# Patient Record
Sex: Female | Born: 1950 | Race: White | Hispanic: No | Smoking: Former smoker
Health system: Southern US, Community
[De-identification: ages and names within clinical notes are randomized; demographics above are authoritative.]

## PROBLEM LIST (undated history)

## (undated) DIAGNOSIS — N289 Disorder of kidney and ureter, unspecified: Secondary | ICD-10-CM

## (undated) DIAGNOSIS — Z87891 Personal history of nicotine dependence: Secondary | ICD-10-CM

## (undated) HISTORY — DX: Personal history of nicotine dependence: Z87.891

## (undated) HISTORY — PX: ABDOMINAL HYSTERECTOMY: SHX81

## (undated) HISTORY — PX: BREAST SURGERY: SHX581

## (undated) HISTORY — PX: BACK SURGERY: SHX140

## (undated) HISTORY — PX: TONSILLECTOMY: SUR1361

---

## 2007-03-28 ENCOUNTER — Ambulatory Visit: Payer: Self-pay | Admitting: Family Medicine

## 2007-04-09 ENCOUNTER — Ambulatory Visit: Payer: Self-pay | Admitting: Family Medicine

## 2009-06-22 ENCOUNTER — Ambulatory Visit: Payer: Self-pay | Admitting: Family Medicine

## 2009-06-24 ENCOUNTER — Ambulatory Visit: Payer: Self-pay | Admitting: Family Medicine

## 2012-12-25 ENCOUNTER — Ambulatory Visit: Payer: Self-pay | Admitting: Family Medicine

## 2013-02-06 ENCOUNTER — Ambulatory Visit: Payer: Self-pay | Admitting: Physician Assistant

## 2013-06-28 ENCOUNTER — Ambulatory Visit: Payer: Self-pay | Admitting: Family Medicine

## 2013-07-02 ENCOUNTER — Ambulatory Visit: Payer: Self-pay | Admitting: Family Medicine

## 2013-10-26 ENCOUNTER — Ambulatory Visit: Payer: Self-pay | Admitting: Physician Assistant

## 2013-10-26 LAB — URINALYSIS, COMPLETE
BACTERIA: NEGATIVE
Blood: NEGATIVE
GLUCOSE, UR: NEGATIVE
KETONE: NEGATIVE
Leukocyte Esterase: NEGATIVE
NITRITE: NEGATIVE
PROTEIN: NEGATIVE
Ph: 5 (ref 5.0–8.0)
Specific Gravity: >=1.03 (ref 1.000–1.030)

## 2014-02-05 ENCOUNTER — Ambulatory Visit: Payer: Self-pay | Admitting: Hospice and Palliative Medicine

## 2014-12-06 IMAGING — US US CAROTID DUPLEX BILAT
1 series · 13 of 24 positions shown · non-contrast
Comparison: None.

CLINICAL DATA: Right carotid bruit.

EXAM:
BILATERAL CAROTID DUPLEX ULTRASOUND
TECHNIQUE: Gray scale imaging, color Doppler and duplex ultrasound were
performed of bilateral carotid and vertebral arteries in the neck.

[Series 1: us carotid duplex bilat · 0.06mm/px · 13 of 62 slices shown]
[im 1/62]
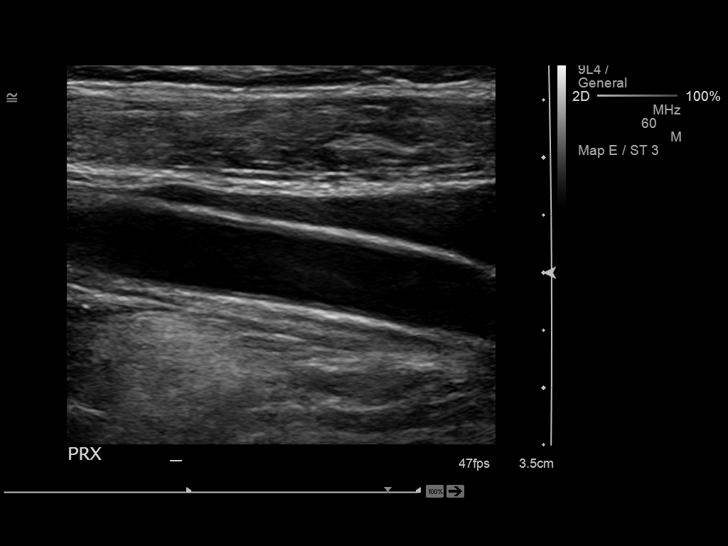
[im 6/62]
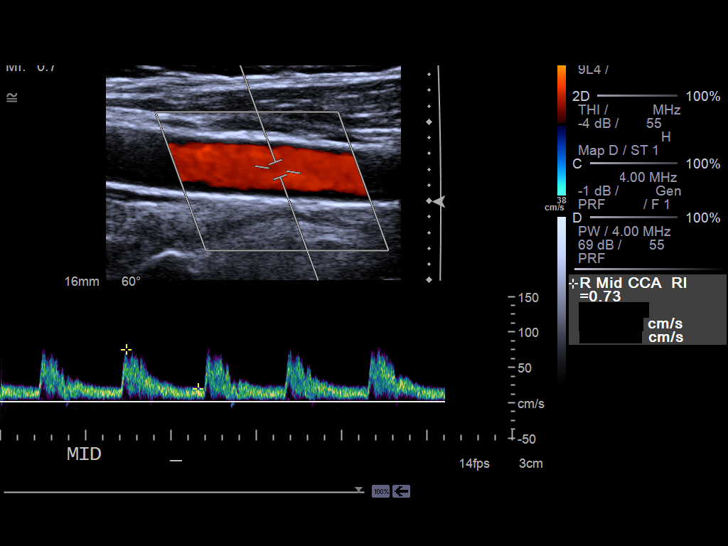
[im 11/62]
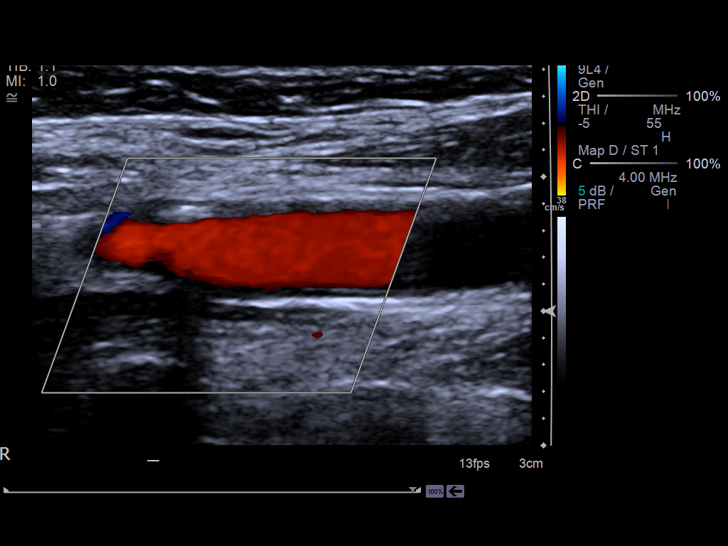
[im 16/62]
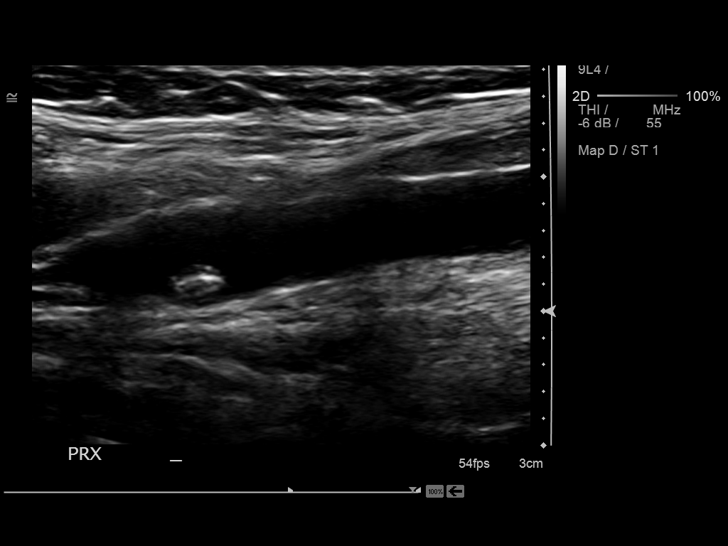
[im 22/62]
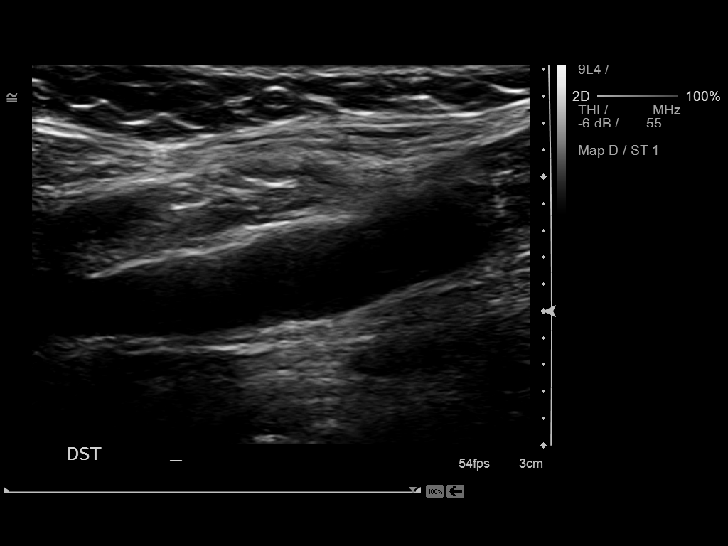
[im 27/62]
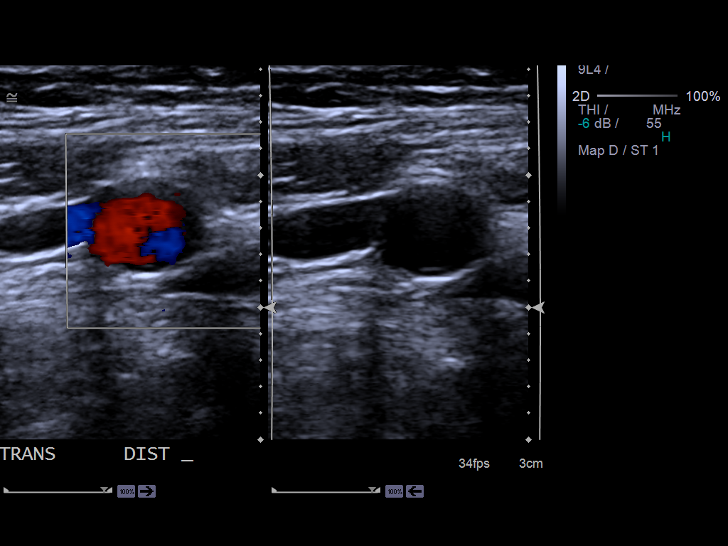
[im 32/62]
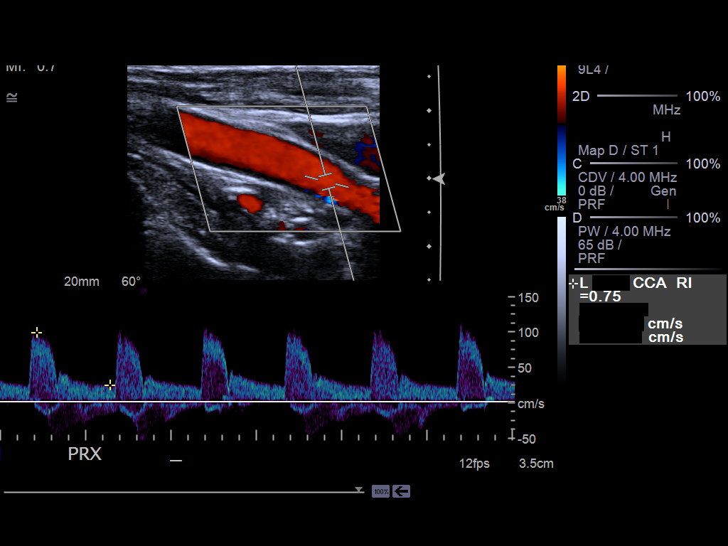
[im 35/62]
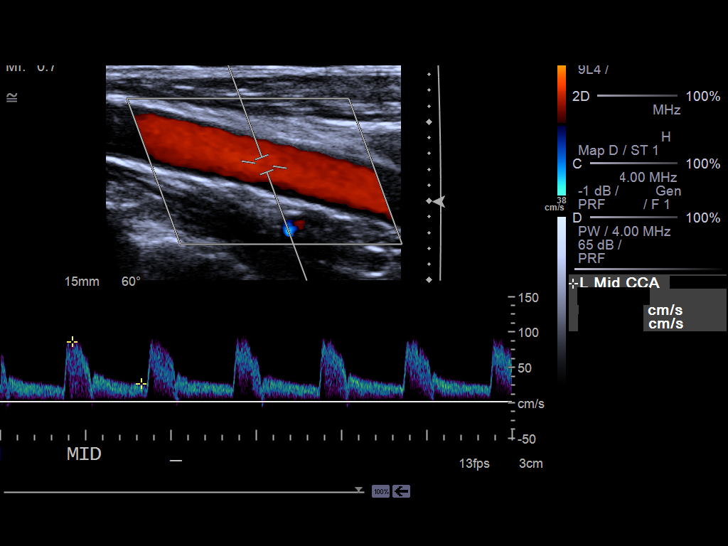
[im 40/62]
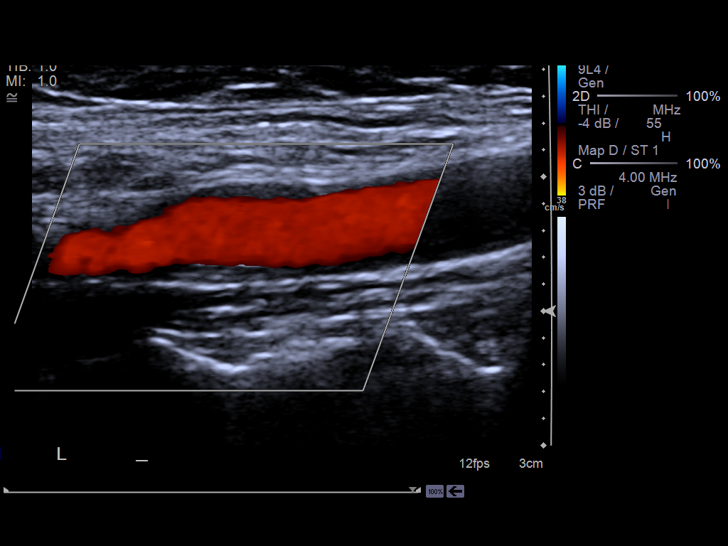
[im 46/62]
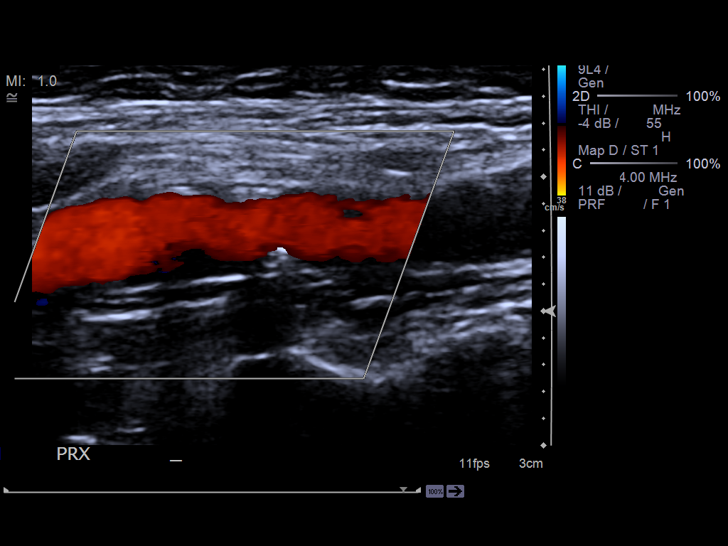
[im 51/62]
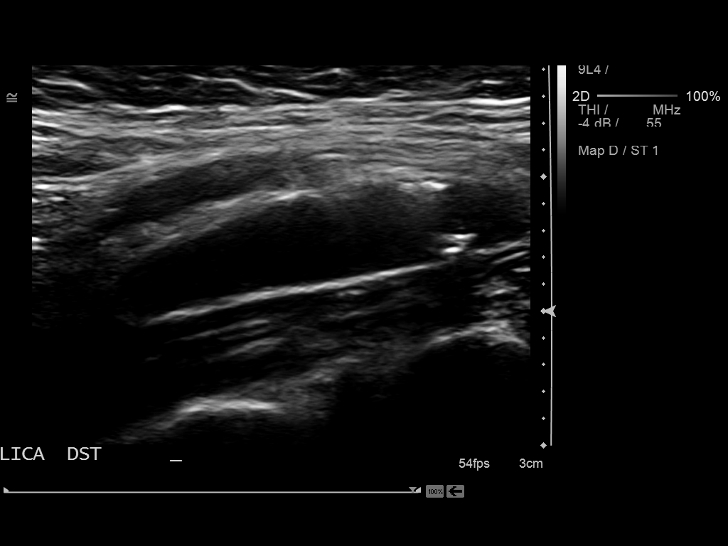
[im 56/62]
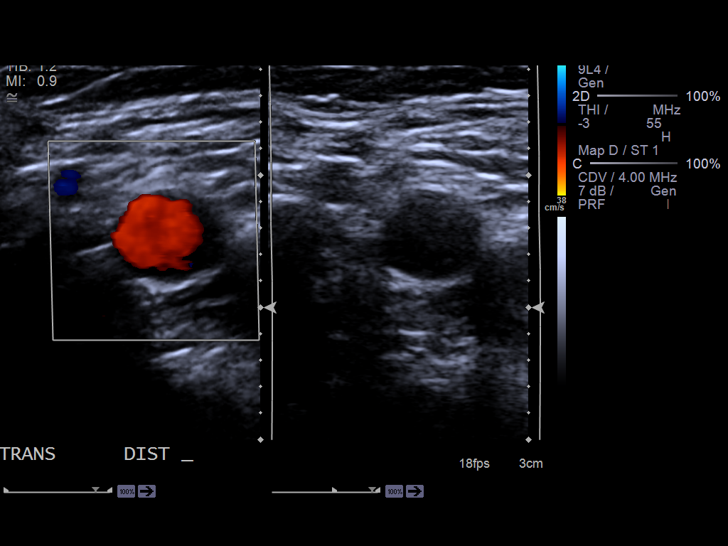
[im 62/62]
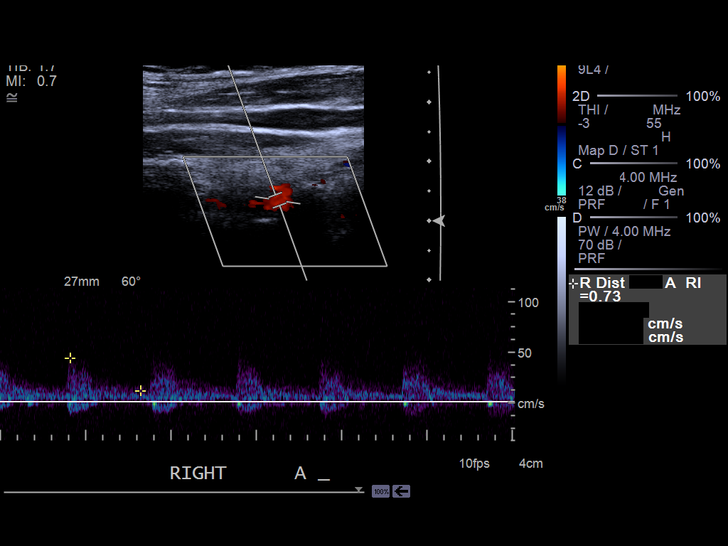

[13 of 24 positions shown; findings below may reference images not displayed]

FINDINGS: Criteria: Quantification of carotid stenosis is based on velocity
parameters that correlate the residual internal carotid diameter
with NASCET-based stenosis levels, using the diameter of the distal
internal carotid lumen as the denominator for stenosis measurement.

The following velocity measurements were obtained:

RIGHT

ICA:  104/30 cm/sec

CCA:  75/20 cm/sec

SYSTOLIC ICA/CCA RATIO:

DIASTOLIC ICA/CCA RATIO:

ECA:  105 cm/sec

LEFT

ICA:  84/26 cm/sec

CCA:  86/27 cm/sec

SYSTOLIC ICA/CCA RATIO:

DIASTOLIC ICA/CCA RATIO:

ECA:  100 cm/sec

RIGHT CAROTID ARTERY: Calcified eccentric plaque is noted in the
proximal right internal carotid artery consistent with less than 50%
diameter stenosis based on ultrasound and Doppler criteria.

RIGHT VERTEBRAL ARTERY:  Antegrade flow is noted.

LEFT CAROTID ARTERY: Minimal plaque formation is noted in the left
carotid bulb and proximal left internal carotid artery consistent
with less than 50% diameter stenosis based on ultrasound and Doppler
criteria.

LEFT VERTEBRAL ARTERY:  Antegrade flow is noted.
IMPRESSION: No hemodynamically significant stenosis or plaque is noted in either
cervical carotid artery.

## 2015-05-11 ENCOUNTER — Telehealth: Payer: Self-pay | Admitting: *Deleted

## 2015-05-11 NOTE — Telephone Encounter (Signed)
Left voicemail  message for patient notifying them that it is time to schedule the recommended follow up lung cancer screening low dose CT scan. Requested patient to call back to verify information prior to the scan being scheduled 

## 2015-05-20 ENCOUNTER — Telehealth: Payer: Self-pay | Admitting: *Deleted

## 2015-05-20 NOTE — Telephone Encounter (Signed)
Notified patient that it is time to have lung cancer screening scan done. Verified patient's age, no signs of lung cancer, no illness that would prevent patient from receiving treatment for lung cancer, and smoking history (quit 2015, 50 pack year history). Patient is expecting call from scheduling with appointment for screening scan and knows to call me with any questions. Original shared decision making visit 02/05/14.

## 2015-06-24 ENCOUNTER — Other Ambulatory Visit: Payer: Self-pay | Admitting: Family Medicine

## 2015-06-24 ENCOUNTER — Encounter: Payer: Self-pay | Admitting: Family Medicine

## 2015-06-24 DIAGNOSIS — Z87891 Personal history of nicotine dependence: Secondary | ICD-10-CM

## 2015-06-24 HISTORY — DX: Personal history of nicotine dependence: Z87.891

## 2015-07-08 ENCOUNTER — Ambulatory Visit
Admission: RE | Admit: 2015-07-08 | Discharge: 2015-07-08 | Disposition: A | Discharge status: Home / Self Care | Payer: BC Managed Care – PPO | Source: Ambulatory Visit | Attending: Family Medicine | Admitting: Family Medicine

## 2015-07-08 DIAGNOSIS — I2510 Atherosclerotic heart disease of native coronary artery without angina pectoris: Secondary | ICD-10-CM | POA: Diagnosis not present

## 2015-07-08 DIAGNOSIS — I700 Atherosclerosis of aorta: Secondary | ICD-10-CM | POA: Insufficient documentation

## 2015-07-08 DIAGNOSIS — K449 Diaphragmatic hernia without obstruction or gangrene: Secondary | ICD-10-CM | POA: Insufficient documentation

## 2015-07-08 DIAGNOSIS — R160 Hepatomegaly, not elsewhere classified: Secondary | ICD-10-CM | POA: Diagnosis not present

## 2015-07-08 DIAGNOSIS — Z87891 Personal history of nicotine dependence: Secondary | ICD-10-CM | POA: Diagnosis not present

## 2015-10-06 ENCOUNTER — Other Ambulatory Visit: Payer: Self-pay | Admitting: Student

## 2015-10-06 DIAGNOSIS — K7460 Unspecified cirrhosis of liver: Secondary | ICD-10-CM

## 2015-10-09 ENCOUNTER — Ambulatory Visit: Payer: BC Managed Care – PPO

## 2015-10-12 ENCOUNTER — Ambulatory Visit: Payer: BC Managed Care – PPO

## 2016-01-01 ENCOUNTER — Ambulatory Visit
Admission: EM | Admit: 2016-01-01 | Discharge: 2016-01-01 | Disposition: A | Discharge status: Home / Self Care | Payer: BC Managed Care – PPO | Attending: Family Medicine | Admitting: Family Medicine

## 2016-01-01 ENCOUNTER — Encounter: Payer: Self-pay | Admitting: *Deleted

## 2016-01-01 DIAGNOSIS — J40 Bronchitis, not specified as acute or chronic: Secondary | ICD-10-CM | POA: Diagnosis not present

## 2016-01-01 DIAGNOSIS — J0100 Acute maxillary sinusitis, unspecified: Secondary | ICD-10-CM | POA: Diagnosis not present

## 2016-01-01 HISTORY — DX: Disorder of kidney and ureter, unspecified: N28.9

## 2016-01-01 MED ORDER — DOXYCYCLINE HYCLATE 100 MG PO CAPS: 100.0000 mg | ORAL_CAPSULE | Freq: Two times a day (BID) | ORAL | 0 refills | Status: AC

## 2016-01-01 MED ORDER — BENZONATATE 100 MG PO CAPS: 100.0000 mg | ORAL_CAPSULE | Freq: Three times a day (TID) | ORAL | 0 refills | Status: AC | PRN

## 2016-01-01 MED ORDER — PREDNISONE 10 MG PO TABS: ORAL_TABLET | ORAL | 0 refills | Status: AC

## 2016-01-01 NOTE — Discharge Instructions (Signed)
Take medication as prescribed. Rest. Drink plenty of fluids. Use home albuterol inhalers as needed as discussed.   Follow up with your primary care physician this week as needed. Return to Urgent care for new or worsening concerns.

## 2016-01-01 NOTE — ED Triage Notes (Signed)
Patient started having symptoms nasal pain and congestion 2 weeks ago. Additional symptoms of sore throat and productive cough are present. PAtient has a history of sinus infection.

## 2016-01-01 NOTE — ED Provider Notes (Signed)
[facility] ____________________________________________  Time seen: Approximately 6:02 PM  I have reviewed the triage vital signs and the nursing notes.   HISTORY  Chief Complaint Nasal Congestion; Cough; and Sore Throat   HPI Patricia Myers is a 65 y.o. female presents for complaints of 2 weeks of runny nose, nasal congestion, sinus pressure and postnasal drainage. Patient reports cough associated with this with increased cough at night. Patient does report occasional wheezing with cough. Patient does report history of COPD as well as chronic seasonal allergies and sinusitis. Denies recent antibiotic use. Denies known sick contacts. Denies fevers. Reports overall continues to eat and drink well. Reports symptoms unresolved over-the-counter cough and congestion medication in addition to her regular allergy medications.  Denies chest pain or shortness of breath, abdominal pain, dysuria, extremity pain or change in chronic extremity swelling.   Rayetta HumphreyGeorge, Sionne A, MD: PCP   Past Medical History:  Diagnosis Date  . Personal history of tobacco use, presenting hazards to health 06/24/2015  . 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 Seasonal allergies. Per patient, COPD. Per epic most recent creatinine 06/25/2015 of 1 and 06/25/2015 BUN 15  Patient Active Problem List   Diagnosis Date Noted  . Personal history of tobacco use, presenting hazards to health 06/24/2015    Past Surgical History:  Procedure Laterality Date  . ABDOMINAL HYSTERECTOMY    . BACK SURGERY    . BREAST SURGERY    . TONSILLECTOMY      Current Outpatient Rx  . Order #: [id number] Class: Historical Med  . Order #: [id number] Class: Historical Med  . Order #: [id number] Class: Historical Med  . Order #: [id number] Class: Historical Med  . Order #: [id number] Class: Historical Med  . Order #: [id number] Class: Normal  . Order #: [id number] Class: Normal  . Order #: [id number] Class:  Normal    No current facility-administered medications for this encounter.   Current Outpatient Prescriptions:  .  Cholecalciferol (VITAMIN D PO), Take by mouth., Disp: , Rfl:  .  methimazole (TAPAZOLE) 5 MG tablet, Take 5 mg by mouth daily., Disp: , Rfl:  .  montelukast (SINGULAIR) 10 MG tablet, Take 10 mg by mouth at bedtime., Disp: , Rfl:  .  sertraline (ZOLOFT) 25 MG tablet, Take 25 mg by mouth daily., Disp: , Rfl:  .  verapamil (VERELAN PM) 180 MG 24 hr capsule, Take 180 mg by mouth at bedtime., Disp: , Rfl:  .  benzonatate (TESSALON PERLES) 100 MG capsule, Take 1 capsule (100 mg total) by mouth 3 (three) times daily as needed for cough., Disp: 15 capsule, Rfl: 0 .  doxycycline (VIBRAMYCIN) 100 MG capsule, Take 1 capsule (100 mg total) by mouth 2 (two) times daily., Disp: 20 capsule, Rfl: 0 .  predniSONE (DELTASONE) 10 MG tablet, Start 60 mg po day one, then 50 mg po day two, taper by 10 mg daily until complete., Disp: 21 tablet, Rfl: 0  Allergies Shellfish allergy; Ciprofloxacin; Penicillins; and Sulfa antibiotics  History reviewed. No pertinent family history.  Social History Social History  Substance Use Topics  . Smoking status: Former Games developermoker  . Smokeless tobacco: Never Used  . Alcohol use Not on file    Review of Systems Constitutional: No fever/chills Eyes: No visual changes. ENT: As above. States mild sore throat. Cardiovascular: Denies chest pain. Respiratory: Denies shortness of breath. Gastrointestinal: No abdominal pain.  No nausea, no vomiting.  No diarrhea.  No constipation. Genitourinary: Negative for dysuria. Musculoskeletal: Negative for back pain. Skin: Negative  for rash. Neurological: Negative for headaches, focal weakness or numbness.  10-point ROS otherwise negative.  ____________________________________________   PHYSICAL EXAM:  VITAL SIGNS: ED Triage Vitals  Enc Vitals Group     BP 01/01/16 1739 (!) 157/79     Pulse Rate 01/01/16 1739 66       Resp 01/01/16 1739 16     Temp 01/01/16 1739 98.8 F (37.1 C)     Temp Source 01/01/16 1739 Oral     SpO2 01/01/16 1739 98 %     Weight 01/01/16 1740 220 lb (99.8 kg)     Height 01/01/16 1740 5\' 7"  (1.702 m)     Head Circumference --      Peak Flow --      Pain Score 01/01/16 1751 7     Pain Loc --      Pain Edu? --      Excl. in GC? --    Constitutional: Alert and oriented. Well appearing and in no acute distress. Eyes: Conjunctivae are normal. PERRL. EOMI. Head: Atraumatic.Mild to moderate tenderness to palpation bilateral frontal and maxillary sinuses. No swelling. No erythema.   Ears: no erythema, normal TMs bilaterally.   Nose: nasal congestion with bilateral nasal turbinate erythema and edema.   Mouth/Throat: Mucous membranes are moist.  Oropharynx non-erythematous.No exudate.  Neck: No stridor.  No cervical spine tenderness to palpation. Hematological/Lymphatic/Immunilogical: No cervical lymphadenopathy. Cardiovascular: Normal rate, regular rhythm. Grossly normal heart sounds.  Good peripheral circulation. Respiratory: Normal respiratory effort.  No retractions. No rales or rhonchi. Mild scattered inspiratory wheezes, improved after cough. Dry intermittent cough noted in room. Good air movement.  Gastrointestinal: Soft and nontender.No CVA tenderness. Musculoskeletal: Ambulatory with steady gait. Bilateral pedal pulses equal and easily palpated. No cervical, thoracic or lumbar tenderness to palpation. Mild bilateral lower extremity edema, per patient chronic and unchanged in several months. Neurologic:  Normal speech and language.No gait instability. Skin:  Skin is warm, dry and intact. No rash noted. Psychiatric: Mood and affect are normal. Speech and behavior are normal.  ___________________________________________   LABS (all labs ordered are listed, but only abnormal results are displayed)  Labs Reviewed - No data to display   PROCEDURES Procedures      INITIAL IMPRESSION / ASSESSMENT AND PLAN / ED COURSE  Pertinent labs & imaging results that were available during my care of the patient were reviewed by me and considered in my medical decision making (see chart for details).  Well-appearing patient. No acute distress. Patients 2 grandsons at bedside. Suspect sinusitis and bronchitis. History of COPD per patient with mild wheezes noted. No focal areas of consolidation auscultated. No rales or rhonchi. Will treat with oral doxycycline and prednisone and when necessary Tessalon Perles. Patient states that she does have home albuterol inhaler, use albuterol inhaler as needed. Encourage rest, fluids and supportive care. Encourage PCP follow up.Discussed indication, risks and benefits of medications with patient.  Discussed follow up with Primary care physician this week. Discussed follow up and return parameters including no resolution or any worsening concerns. Patient verbalized understanding and agreed to plan.   ____________________________________________   FINAL CLINICAL IMPRESSION(S) / ED DIAGNOSES  Final diagnoses:  Acute maxillary sinusitis, recurrence not specified  Bronchitis     Discharge Medication List as of 01/01/2016  6:19 PM    START taking these medications   Details  benzonatate (TESSALON PERLES) 100 MG capsule Take 1 capsule (100 mg total) by mouth 3 (three) times daily as needed for cough.,  Starting Fri 01/01/2016, Normal    doxycycline (VIBRAMYCIN) 100 MG capsule Take 1 capsule (100 mg total) by mouth 2 (two) times daily., Starting Fri 01/01/2016, Normal    predniSONE (DELTASONE) 10 MG tablet Start 60 mg po day one, then 50 mg po day two, taper by 10 mg daily until complete., Normal        Note: This dictation was prepared with Dragon dictation along with smaller phrase technology. Any transcriptional errors that result from this process are unintentional.    Clinical Course       Renford DillsLindsey Siniyah Evangelist,  NP 01/01/16 1935

## 2016-07-05 ENCOUNTER — Telehealth: Payer: Self-pay | Admitting: *Deleted

## 2016-07-05 NOTE — Telephone Encounter (Signed)
Left message for patient to notify them that it is time to schedule annual low dose lung cancer screening CT scan. Instructed patient to call back to verify information prior to the scan being scheduled.  

## 2016-07-28 ENCOUNTER — Telehealth: Payer: Self-pay | Admitting: *Deleted

## 2016-07-28 NOTE — Telephone Encounter (Signed)
Left message for patient to notify them that it is time to schedule annual low dose lung cancer screening CT scan. Instructed patient to call back to verify information prior to the scan being scheduled.  

## 2016-08-04 ENCOUNTER — Encounter: Payer: Self-pay | Admitting: *Deleted

## 2016-08-26 ENCOUNTER — Ambulatory Visit
Admission: RE | Admit: 2016-08-26 | Discharge: 2016-08-26 | Disposition: A | Discharge status: Home / Self Care | Payer: BC Managed Care – PPO | Source: Ambulatory Visit | Attending: Family Medicine | Admitting: Family Medicine

## 2016-08-26 ENCOUNTER — Other Ambulatory Visit: Payer: Self-pay | Admitting: Family Medicine

## 2016-08-26 DIAGNOSIS — M7989 Other specified soft tissue disorders: Secondary | ICD-10-CM | POA: Diagnosis not present

## 2017-12-01 ENCOUNTER — Other Ambulatory Visit: Payer: Self-pay | Admitting: Orthopedic Surgery

## 2017-12-01 DIAGNOSIS — M5442 Lumbago with sciatica, left side: Principal | ICD-10-CM

## 2017-12-01 DIAGNOSIS — M5416 Radiculopathy, lumbar region: Secondary | ICD-10-CM

## 2017-12-01 DIAGNOSIS — G8929 Other chronic pain: Secondary | ICD-10-CM

## 2017-12-21 ENCOUNTER — Ambulatory Visit: Payer: Medicare Other

## 2018-01-05 ENCOUNTER — Ambulatory Visit
Admission: RE | Admit: 2018-01-05 | Discharge: 2018-01-05 | Disposition: A | Discharge status: Home / Self Care | Payer: Medicare Other | Source: Ambulatory Visit | Attending: Orthopedic Surgery | Admitting: Orthopedic Surgery

## 2018-01-05 DIAGNOSIS — M5442 Lumbago with sciatica, left side: Secondary | ICD-10-CM | POA: Diagnosis present

## 2018-01-05 DIAGNOSIS — M5416 Radiculopathy, lumbar region: Secondary | ICD-10-CM | POA: Diagnosis present

## 2018-01-05 DIAGNOSIS — G8929 Other chronic pain: Secondary | ICD-10-CM | POA: Diagnosis present

## 2018-01-05 DIAGNOSIS — M5136 Other intervertebral disc degeneration, lumbar region: Secondary | ICD-10-CM | POA: Diagnosis not present

## 2018-01-05 DIAGNOSIS — M48061 Spinal stenosis, lumbar region without neurogenic claudication: Secondary | ICD-10-CM | POA: Insufficient documentation

## 2018-01-05 DIAGNOSIS — M5126 Other intervertebral disc displacement, lumbar region: Secondary | ICD-10-CM | POA: Insufficient documentation

## 2020-08-17 DEATH — deceased
